# Patient Record
Sex: Male | Born: 1935 | Race: White | Hispanic: No | Marital: Married | Smoking: Never smoker
Health system: Southern US, Community
[De-identification: ages and names within clinical notes are randomized; demographics above are authoritative.]

## PROBLEM LIST (undated history)

## (undated) DIAGNOSIS — R011 Cardiac murmur, unspecified: Secondary | ICD-10-CM

## (undated) DIAGNOSIS — R32 Unspecified urinary incontinence: Secondary | ICD-10-CM

## (undated) DIAGNOSIS — E785 Hyperlipidemia, unspecified: Secondary | ICD-10-CM

## (undated) DIAGNOSIS — T7840XA Allergy, unspecified, initial encounter: Secondary | ICD-10-CM

## (undated) DIAGNOSIS — F419 Anxiety disorder, unspecified: Secondary | ICD-10-CM

## (undated) HISTORY — DX: Allergy, unspecified, initial encounter: T78.40XA

## (undated) HISTORY — DX: Hyperlipidemia, unspecified: E78.5

## (undated) HISTORY — DX: Anxiety disorder, unspecified: F41.9

## (undated) HISTORY — DX: Unspecified urinary incontinence: R32

## (undated) HISTORY — DX: Cardiac murmur, unspecified: R01.1

---

## 2017-09-17 HISTORY — PX: AORTIC VALVE REPLACEMENT: SHX41

## 2017-12-16 HISTORY — PX: BACK SURGERY: SHX140

## 2018-04-17 HISTORY — PX: LUMBAR DISC SURGERY: SHX700

## 2018-08-20 ENCOUNTER — Ambulatory Visit (INDEPENDENT_AMBULATORY_CARE_PROVIDER_SITE_OTHER): Payer: Federal, State, Local not specified - PPO | Admitting: Family Medicine

## 2018-08-20 ENCOUNTER — Encounter: Payer: Self-pay | Admitting: Family Medicine

## 2018-08-20 ENCOUNTER — Ambulatory Visit: Payer: Self-pay | Admitting: Family Medicine

## 2018-08-20 VITALS — BP 120/55 | HR 75 | Temp 98.0°F | Resp 16 | Ht 69.0 in

## 2018-08-20 DIAGNOSIS — Z952 Presence of prosthetic heart valve: Secondary | ICD-10-CM

## 2018-08-20 DIAGNOSIS — Z7689 Persons encountering health services in other specified circumstances: Secondary | ICD-10-CM

## 2018-08-20 DIAGNOSIS — R05 Cough: Secondary | ICD-10-CM

## 2018-08-20 DIAGNOSIS — M150 Primary generalized (osteo)arthritis: Secondary | ICD-10-CM

## 2018-08-20 DIAGNOSIS — M159 Polyosteoarthritis, unspecified: Secondary | ICD-10-CM

## 2018-08-20 DIAGNOSIS — R531 Weakness: Secondary | ICD-10-CM | POA: Diagnosis not present

## 2018-08-20 DIAGNOSIS — J019 Acute sinusitis, unspecified: Secondary | ICD-10-CM

## 2018-08-20 DIAGNOSIS — F419 Anxiety disorder, unspecified: Secondary | ICD-10-CM

## 2018-08-20 DIAGNOSIS — M5441 Lumbago with sciatica, right side: Secondary | ICD-10-CM

## 2018-08-20 DIAGNOSIS — R058 Other specified cough: Secondary | ICD-10-CM

## 2018-08-20 DIAGNOSIS — I10 Essential (primary) hypertension: Secondary | ICD-10-CM

## 2018-08-20 DIAGNOSIS — E782 Mixed hyperlipidemia: Secondary | ICD-10-CM

## 2018-08-20 DIAGNOSIS — F330 Major depressive disorder, recurrent, mild: Secondary | ICD-10-CM

## 2018-08-20 DIAGNOSIS — G609 Hereditary and idiopathic neuropathy, unspecified: Secondary | ICD-10-CM

## 2018-08-20 DIAGNOSIS — N3946 Mixed incontinence: Secondary | ICD-10-CM

## 2018-08-20 DIAGNOSIS — M5442 Lumbago with sciatica, left side: Secondary | ICD-10-CM

## 2018-08-20 DIAGNOSIS — G8929 Other chronic pain: Secondary | ICD-10-CM

## 2018-08-20 MED ORDER — PSEUDOEPH-BROMPHEN-DM 30-2-10 MG/5ML PO SYRP: 5.0000 mL | ORAL_SOLUTION | Freq: Four times a day (QID) | ORAL | 0 refills | Status: AC | PRN

## 2018-08-20 MED ORDER — FLUTICASONE PROPIONATE 50 MCG/ACT NA SUSP: 2.0000 | Freq: Every day | NASAL | 1 refills | Status: AC

## 2018-08-20 NOTE — Patient Instructions (Addendum)
Thank you for coming to the office today.  Please call and check with insurance coverage  [facility] / Surgery performed at [facility] [address] [place], [place] [address] Ph - [phone]  If you need a referral please call us back to notify us - otherwise if they cannot cover with future insurance - then we will need to find a different Neurosurgery specialist.  -----------------------------------------  Referral to [facility] locally for home health - PT, OT, RN and SW - they will help coordinate his care, ultimately if he does require higher level of care then we may go to SNF in future.  ------------------  Start nasal steroid Flonase 2 sprays in each nostril daily for 4-6 weeks, may repeat course seasonally or as needed  Start cough syrup   Please schedule a Follow-up Appointment to: Return in about 6 weeks (around 10/01/2018) for Follow-up 6 weeks for home health progress /  neuropathy / spine surgery.  If you have any other questions or concerns, please feel free to call the office or send a message through MyChart. You may also schedule an earlier appointment if necessary.  Additionally, you may be receiving a survey about your experience at our office within a few days to 1 week by e-mail or mail. We value your feedback.  Saralyn PilarAlexander Bushra Denman, DO [facility]

## 2018-08-20 NOTE — Progress Notes (Addendum)
Subjective:    Patient ID: Francisco Gomez, male    DOB: August 28, 1927, 84 y.o.   MRN: [id number]  Francisco Gomez is a 84 y.o. male presenting on 08/20/2018 for Establish Care (left skill nursing fro [facility] need pateint to be seen by [facility] physician so he can gets order like PT,OT ); Numbness (both feet patient has back surgery around 12/2017. ); and Sinusitis (onset 5 days runny nose)  Here to establish as new patient. Accompanied by daughter in law and his wife who provide additional history. Patient has recently moved to [name] from [place] after he was residing in SNF, now home to be with family as help with caregiver, they are in need of HH orders.  HPI  Generalized Debilitation/Weakness / Chronic Back Pain / Osteoarthritis, s/p Lumbar Spinal Surgery, herniated disc repair / Peripheral Neuropathy Bilateral Lower Extremity - Reviewed past history with fall on ice back in [place] last winter 2018, suffered back injury, ultimately required back surgery 12/2017, later he had need for repeat back surgery in 04/2018 for herniated disc, placed into SNF, and then discharged to ALF but required escalated care and returned to SNF. He was at [facility] until just recently before Thanksgiving when moved down to [place] to be with family. They are now asking to transfer care to home health [facility] for re-evaluation and management with home therapy and assistance. Unsure if he needs higher level of care. - He has significant limited mobility with ambulation, has had weakness affecting gait and numbness in bilateral feet - seems to be new problem with worsening numbness over past 1-2 weeks after long travel, he has history of similar but not this significant, he already has history of spinal nerve damage is on Lyrica high dose after back surgery, on Tramadol PRN - Patient's daughter in law is primary caregiver now, his wife is unable to provide adequate care and his son is not home significant amount of  time  Cardiac Disease / S/p Aortic Valve Replacement 09/2017 - was on plavix has been taken off.  Abnormality Incidental finding of bladder/kidney Requested record for more information. But he reports - Aortic Valve replaced in January 2019 - discovered a spot on kidney or bladder on MRI - he had an appointment with Urologist, but then it was cancelled and never re-scheduled.  Additional complaint - Rhinosinusitis vs URI cough - reports recent worsening vs persistent cough, non productive, has runny nose and sinus congestion, without fever chills or sinus purulence. Denies muscle aches or body aches. Taking coricidan asking for other cough medicine.   Health Maintenance: Requesting outside records from prior PCP  Depression screen [facility] 2/9 08/21/2018  Decreased Interest 1  Down, Depressed, Hopeless 2  PHQ - 2 Score 3  Altered sleeping 2  Tired, decreased energy 3  Change in appetite 0  Feeling bad or failure about yourself  3  Trouble concentrating 2  Moving slowly or fidgety/restless 0  Suicidal thoughts 0  PHQ-9 Score 13  Difficult doing work/chores Somewhat difficult   GAD 7 : Generalized Anxiety Score 08/21/2018  Nervous, Anxious, on Edge 2  Control/stop worrying 1  Worry too much - different things 1  Trouble relaxing 0  Restless 1  Easily annoyed or irritable 0  Afraid - awful might happen 2  Total GAD 7 Score 7  Anxiety Difficulty Very difficult      No flowsheet data found.  Past Medical History:  Diagnosis Date  . Allergy   .  Anxiety   . Heart murmur   . Hyperlipidemia   . Urinary incontinence    Past Surgical History:  Procedure Laterality Date  . AORTIC VALVE REPLACEMENT  09/2017  . [place] - required repeat surgery 04/2018 Herniated disc  . LUMBAR DISC SURGERY  04/2018   Social History   Socioeconomic History  . Marital status: Married    Spouse name: Not on file  . Number of children: Not on file  . Years  of education: Not on file  . Highest education level: Not on file  Occupational History  . Not on file  Social Needs  . Financial resource strain: Not on file  . Food insecurity:    Worry: Not on file    Inability: Not on file  . Transportation needs:    Medical: Not on file    Non-medical: Not on file  Tobacco Use  . Smoking status: Never Smoker  . Smokeless tobacco: Never Used  Substance and Sexual Activity  . Alcohol use: Yes    Alcohol/week: 7.0 standard drinks    Types: 7 Standard drinks or equivalent per week    Frequency: Never  . Drug use: Never  . Sexual activity: Not on file  Lifestyle  . Physical activity:    Days per week: Not on file    Minutes per session: Not on file  . Stress: Not on file  Relationships  . Social connections:    Talks on phone: Not on file    Gets together: Not on file    Attends religious service: Not on file    Active member of club or organization: Not on file    Attends meetings of clubs or organizations: Not on file    Relationship status: Not on file  . Intimate partner violence:    Fear of current or ex partner: Not on file    Emotionally abused: Not on file    Physically abused: Not on file    Forced sexual activity: Not on file  Other Topics Concern  . Not on file  Social History Narrative  . Not on file   History reviewed. No pertinent family history. Current Outpatient Medications on File Prior to Visit  Medication Sig  . acetaminophen (TYLENOL) 325 MG tablet Take 650 mg by mouth every 6 (six) hours as needed.  . Acetaminophen 325 MG CAPS Take by mouth.  Marland Kitchen. allopurinol (ZYLOPRIM) 300 MG tablet Take 300 mg by mouth daily.  Marland Kitchen. amLODipine (NORVASC) 5 MG tablet Take 5 mg by mouth daily.  Marland Kitchen. aspirin EC 81 MG tablet Take 81 mg by mouth daily.  Marland Kitchen. atorvastatin (LIPITOR) 80 MG tablet Take 80 mg by mouth at bedtime.  . chlorthalidone (HYGROTON) 25 MG tablet Take 25 mg by mouth daily.  . citalopram (CELEXA) 10 MG tablet Take 10 mg  by mouth daily.  Marland Kitchen. DOCUSATE SODIUM PO Take 100 mg by mouth once.  . finasteride (PROSCAR) 5 MG tablet Take 5 mg by mouth daily.  . hydrOXYzine (ATARAX/VISTARIL) 25 MG tablet Take 25 mg by mouth every 8 (eight) hours as needed.  . lactulose (CHRONULAC) 10 GM/15ML solution Take by mouth 3 (three) times daily.  Marland Kitchen. latanoprost (XALATAN) 0.005 % ophthalmic solution 1 drop at bedtime.  . Magnesium Hydroxide (MILK OF MAGNESIA PO) Take 30 mLs by mouth.  . methylPREDNISolone (MEDROL) 4 MG tablet Take 4 mg by mouth daily.  . metoprolol succinate (TOPROL-XL) 50 MG 24  hr tablet Take 50 mg by mouth daily. Take with or immediately following a meal.  . mineral oil enema Place 1 enema rectally once.  . nitroGLYCERIN (NITROSTAT) 0.4 MG SL tablet Place 0.4 mg under the tongue every 5 (five) minutes as needed for chest pain.  Marland Kitchen omeprazole (PRILOSEC) 20 MG capsule Take 20 mg by mouth daily.  . pregabalin (LYRICA) 200 MG capsule Take 200 mg by mouth 2 (two) times daily.  Marland Kitchen rOPINIRole (REQUIP) 0.25 MG tablet Take 0.25 mg by mouth at bedtime.  . senna (SENOKOT) 8.6 MG TABS tablet Take 1 tablet by mouth.  . tamsulosin (FLOMAX) 0.4 MG CAPS capsule Take 0.4 mg by mouth.  . traMADol (ULTRAM) 50 MG tablet Take by mouth every 6 (six) hours as needed.   No current facility-administered medications on file prior to visit.     Review of Systems Per HPI unless specifically indicated above     Objective:    BP (!) 120/55   Pulse 75   Temp 98 F (36.7 C) (Oral)   Resp 16   Ht 5\' 9"  (1.753 m)   SpO2 99%   Wt Readings from Last 3 Encounters:  No data found for Wt    Physical Exam  Constitutional: He is oriented to person, place, and time. He appears well-developed and well-nourished. No distress.  Elderly 84 year old male, mostly comfortable but wheelchair bound, cooperative  HENT:  Head: Normocephalic and atraumatic.  Mouth/Throat: Oropharynx is clear and moist.  Frontal / maxillary sinuses non-tender. Nares  patent with some rhinorrhea and congestion without purulence. Bilateral TMs clear without erythema, effusion or bulging. Oropharynx with some post nasal drainage without erythema, exudates, edema or asymmetry.  Eyes: Conjunctivae are normal. Right eye exhibits no discharge. Left eye exhibits no discharge.  Cardiovascular: Normal rate.  Pulmonary/Chest: Effort normal. No respiratory distress. He has no wheezes. He has no rales.  Musculoskeletal: He exhibits edema (+1 pitting edema lower extremity symmetrical).  Generalized muscle weakness on exam  Neurological: He is alert and oriented to person, place, and time. A sensory deficit is present. No cranial nerve deficit.  Significantly reduced sensation to light touch and monofilament bilateral lower extremity ankles to feet  Skin: Skin is warm and dry. No rash noted. He is not diaphoretic. No erythema.  Psychiatric: He has a normal mood and affect. His behavior is normal.  Well groomed, good eye contact, normal speech and thoughts  Nursing note and vitals reviewed.  No results found for this or any previous visit.    Assessment & Plan:   Problem List Items Addressed This Visit    Anxiety   Relevant Medications   hydrOXYzine (ATARAX/VISTARIL) 25 MG tablet   citalopram (CELEXA) 10 MG tablet   Chronic back pain   Relevant Medications   aspirin EC 81 MG tablet   traMADol (ULTRAM) 50 MG tablet   methylPREDNISolone (MEDROL) 4 MG tablet   acetaminophen (TYLENOL) 325 MG tablet   Acetaminophen 325 MG CAPS   Essential hypertension   Relevant Medications   aspirin EC 81 MG tablet   nitroGLYCERIN (NITROSTAT) 0.4 MG SL tablet   metoprolol succinate (TOPROL-XL) 50 MG 24 hr tablet   atorvastatin (LIPITOR) 80 MG tablet   chlorthalidone (HYGROTON) 25 MG tablet   amLODipine (NORVASC) 5 MG tablet   Major depression, recurrent (HCC)   Relevant Medications   hydrOXYzine (ATARAX/VISTARIL) 25 MG tablet   citalopram (CELEXA) 10 MG tablet   Mixed  hyperlipidemia   Relevant  Medications   aspirin EC 81 MG tablet   nitroGLYCERIN (NITROSTAT) 0.4 MG SL tablet   metoprolol succinate (TOPROL-XL) 50 MG 24 hr tablet   atorvastatin (LIPITOR) 80 MG tablet   chlorthalidone (HYGROTON) 25 MG tablet   amLODipine (NORVASC) 5 MG tablet   Osteoarthritis, multiple sites Underlying factor for some chronic pain and debilitation    Relevant Medications   aspirin EC 81 MG tablet   traMADol (ULTRAM) 50 MG tablet   methylPREDNISolone (MEDROL) 4 MG tablet   acetaminophen (TYLENOL) 325 MG tablet   allopurinol (ZYLOPRIM) 300 MG tablet   Acetaminophen 325 MG CAPS   S/P aortic valve replacement Future refer to Cardiology to monitor cardiac disease - review records    Urinary incontinence, mixed Chronic problem, no recent worsening with back injury and problems  Continue meds, future may refer to Urologist for 2nd opinion on prior report of abnormality kidney/bladder on imaging incidental, will need review records    Relevant Medications   tamsulosin (FLOMAX) 0.4 MG CAPS capsule   finasteride (PROSCAR) 5 MG tablet    Other Visit Diagnoses    Acute rhinosinusitis    -  Primary Clinically without sign of bacterial infection. Lungs clear, sinuses non tender, no AOM. Reassuring history, has persistent rhinorrhea w/ cough post nasal drainage most likely  Start nasal steroid Flonase 2 sprays in each nostril daily for 4-6 weeks, may repeat course seasonally or as needed Cough syrup rx as well    Relevant Medications   methylPREDNISolone (MEDROL) 4 MG tablet   fluticasone (FLONASE) 50 MCG/ACT nasal spray   brompheniramine-pseudoephedrine-DM 30-2-10 MG/5ML syrup   Recurrent cough       Relevant Medications   brompheniramine-pseudoephedrine-DM 30-2-10 MG/5ML syrup   Encounter to establish care with new doctor     Request records from prior PCP and specialists including Orthopedic, Cardiology      Generalized Weakness Peripheral  Neuropathy Significantly limited mobility due to variety of chronic illnesses and pain/OA/DJD neuropathy Increased level of care suspected given previous SNF management Needs assistance with some ADLs and transfers now, limited family caregiver support primarily only with daughter.  Plan Order [facility] PT OT RN CSW - future may need placement at SNF if not able to meet goals Will refer to Neurosurgery for 2nd opinion on neuropathy in legs and if likely related to spinal OA/DJD disease - pending family to check with Tamsen Snider first for coverage  Meds ordered this encounter  Medications  . fluticasone (FLONASE) 50 MCG/ACT nasal spray    Sig: Place 2 sprays into both nostrils daily. Use for 4-6 weeks then stop and use seasonally or as needed.    Dispense:  16 g    Refill:  1  . brompheniramine-pseudoephedrine-DM 30-2-10 MG/5ML syrup    Sig: Take 5 mLs by mouth 4 (four) times daily as needed.    Dispense:  118 mL    Refill:  0   Orders Placed This Encounter  Procedures  . Ambulatory referral to Home Health    Referral Priority:   Routine    Referral Type:   Home Health Care    Referral Reason:   Specialty Services Required    Referred to Provider:   [facility]    Requested Specialty:   Home Health Services    Number of Visits Requested:   1   Requesting PT, OT, RN and medical social worker involvement, previously discharged from SNF in [place] about 1-2 weeks ago in November to  move down to [place] with family, however he does not have 24 hour caregiver support and may need additional help, need to determine if he can receive appropriate care at home or may need to reconsider SNF  Follow up plan: Return in about 6 weeks (around 10/01/2018) for Follow-up 6 weeks for home health progress /  neuropathy / spine surgery.  Saralyn Pilar, DO [facility] 08/20/2018, 3:49 PM

## 2018-08-21 ENCOUNTER — Encounter: Payer: Self-pay | Admitting: Family Medicine

## 2018-08-21 DIAGNOSIS — F419 Anxiety disorder, unspecified: Secondary | ICD-10-CM | POA: Insufficient documentation

## 2018-08-21 DIAGNOSIS — I10 Essential (primary) hypertension: Secondary | ICD-10-CM | POA: Insufficient documentation

## 2018-08-21 DIAGNOSIS — M159 Polyosteoarthritis, unspecified: Secondary | ICD-10-CM | POA: Insufficient documentation

## 2018-08-21 DIAGNOSIS — M549 Dorsalgia, unspecified: Secondary | ICD-10-CM

## 2018-08-21 DIAGNOSIS — N3946 Mixed incontinence: Secondary | ICD-10-CM | POA: Insufficient documentation

## 2018-08-21 DIAGNOSIS — Z952 Presence of prosthetic heart valve: Secondary | ICD-10-CM | POA: Insufficient documentation

## 2018-08-21 DIAGNOSIS — G609 Hereditary and idiopathic neuropathy, unspecified: Secondary | ICD-10-CM | POA: Insufficient documentation

## 2018-08-21 DIAGNOSIS — E782 Mixed hyperlipidemia: Secondary | ICD-10-CM | POA: Insufficient documentation

## 2018-08-21 DIAGNOSIS — F339 Major depressive disorder, recurrent, unspecified: Secondary | ICD-10-CM | POA: Insufficient documentation

## 2018-08-21 DIAGNOSIS — R531 Weakness: Secondary | ICD-10-CM | POA: Insufficient documentation

## 2018-08-21 DIAGNOSIS — G8929 Other chronic pain: Secondary | ICD-10-CM | POA: Insufficient documentation

## 2018-08-22 ENCOUNTER — Telehealth: Payer: Self-pay | Admitting: Family Medicine

## 2018-08-22 NOTE — Telephone Encounter (Signed)
Pt daughter called Ines BloomerSherry Kraska requesting that you call her about her father appt. ( that it was not safe to be at home ) [facility] # [phone]

## 2018-08-22 NOTE — Telephone Encounter (Signed)
Cordelia PenSherry advised as per Dr. Kirtland BouchardK.

## 2018-08-22 NOTE — Telephone Encounter (Signed)
I spoke with Rachell about this triaged call. The daughter, Cordelia PenSherry is not on DPR list to my knowledge, reviewed the scanned document.  It sounds like there is a disconnect or miscommunication between [place] and her family, because at our visit with Fenton FoyFrank Vicens and the other family members, there was no problem with our plan and recommendations. I did not specifically say that he was not safe to be at home as well.  Please contact Cordelia PenSherry back and let her know that we cannot share clinical information due to her not being on DPR. I recommend that she discuss her concerns with the family members present at the visit - and if she still has questions she may accompany the patient at the next office visit or arrange to have Homero FellersFrank sign a new form to add her to [facility].  We can call the other family member back if they have questions to clarify any issues.  Home health should be starting soon through [facility]  Saralyn PilarAlexander Karamalegos, DO [facility] 08/22/2018, 10:51 AM

## 2018-08-25 ENCOUNTER — Telehealth: Payer: Self-pay | Admitting: Family Medicine

## 2018-08-25 NOTE — Telephone Encounter (Signed)
Spoke to son was concerned about his feet and hand tingling and urine incontinence. Son was not aware from other family member about dad's condition and he is taking care of him now and his wife is not professional care taker either as per after visit summery he will get referred to Urologist and neurologist but it can take months and now he frustrated and at the point where he wants to take him to ER. He specifically want to talk to Dr. Kirtland BouchardK about all his concerned regarding his dad health condition.

## 2018-08-25 NOTE — Telephone Encounter (Signed)
See telephone note from earlier today 08/25/18.  Discussed concerns with patient's son Morton StallFrank Jr. His sister and patient's daughter, Cordelia PenSherry, called to discuss as well. She is a Engineer, civil (consulting)urse and has several questions.  I advised her to first check with her brother Morton StallFrank Jr to clarify what we discussed so that we do not repeat phone calls too often. She will do this.  I reviewed briefly our plan. She expressed similar concerns.  In addition, with urinary incontinence and urgency, she asked if his dose of Chlorthalidone could be lowered, I advised we can try cut in HALF for Chlorthalidone 12.5mg  dose daily (HALF of 25mg ) this may reduce some urinary urgency and frequency. In future we can consider Urologist.  She discussed his heart history, for now we are waiting to refer to Cards, given goal to stabilize him currently with regards to neuropathy pain and function.  [facility] or may go to hospital if acute worsening - in future we may refer to Neurosurgery/neurology when ready.  Saralyn PilarAlexander Rickelle Sylvestre, DO [facility] 08/25/2018, 4:57 PM

## 2018-08-25 NOTE — Telephone Encounter (Signed)
Pt's daughter Cordelia PenSherry asked for a call back to discuss discrepancies in what pt's care should be [phone]

## 2018-08-25 NOTE — Telephone Encounter (Signed)
Called patient's son, Francisco Gomez, back and discussed his concerns. He expresses several:  1. Neuropathy - bilateral feet, has had this worsening problem more recently. This is a chronic issue that has significantly worsened, and it is concerning patient's family, given his spinal surgery history. I recommended Neurosurgery consultation referral - but am still waiting to hear back from family which provider is covered or that they request. This referral has not been placed yet  2. HH vs SNF - He has HH services about to initiate this Wednesday 12/11 - however family is concerned he is in an unsafe environment at home due to not having 24 hour skilled care, they think that he may have inappropriately left the SNF in [place] and now he needs to return to SNF.  They are asking if he should be seen at hospital now for eval of neuropathy and urinary incontinence, and may determine further treatment or referral or may benefit from expedited discharge to SNF or home services.  I advised that if needed they may seek care at hospital given the time situation urgency, if not then on Weds when [facility] they may determine needs SNF - once this has been arranged, they can locate a bed and we can proceed with FL2  Francisco PilarAlexander Thadd Apuzzo, DO [facility] 08/25/2018, 1:42 PM

## 2018-08-25 NOTE — Telephone Encounter (Signed)
Pt's son, Homero FellersFrank asked for a call back to discuss developments from over the weekend [phone]

## 2018-08-28 ENCOUNTER — Emergency Department: Payer: Federal, State, Local not specified - PPO

## 2018-08-28 ENCOUNTER — Other Ambulatory Visit: Payer: Self-pay

## 2018-08-28 ENCOUNTER — Emergency Department
Admission: EM | Admit: 2018-08-28 | Discharge: 2018-08-28 | Disposition: A | Discharge status: Home / Self Care | Payer: Federal, State, Local not specified - PPO | Attending: Emergency Medicine | Admitting: Emergency Medicine

## 2018-08-28 ENCOUNTER — Encounter: Payer: Self-pay | Admitting: Emergency Medicine

## 2018-08-28 DIAGNOSIS — R32 Unspecified urinary incontinence: Secondary | ICD-10-CM | POA: Insufficient documentation

## 2018-08-28 DIAGNOSIS — Z952 Presence of prosthetic heart valve: Secondary | ICD-10-CM | POA: Insufficient documentation

## 2018-08-28 DIAGNOSIS — M25551 Pain in right hip: Secondary | ICD-10-CM | POA: Diagnosis not present

## 2018-08-28 DIAGNOSIS — Z7982 Long term (current) use of aspirin: Secondary | ICD-10-CM | POA: Diagnosis not present

## 2018-08-28 DIAGNOSIS — Z79899 Other long term (current) drug therapy: Secondary | ICD-10-CM | POA: Insufficient documentation

## 2018-08-28 DIAGNOSIS — R2243 Localized swelling, mass and lump, lower limb, bilateral: Secondary | ICD-10-CM | POA: Diagnosis not present

## 2018-08-28 DIAGNOSIS — R531 Weakness: Secondary | ICD-10-CM | POA: Diagnosis present

## 2018-08-28 DIAGNOSIS — N3289 Other specified disorders of bladder: Secondary | ICD-10-CM | POA: Insufficient documentation

## 2018-08-28 DIAGNOSIS — I10 Essential (primary) hypertension: Secondary | ICD-10-CM | POA: Diagnosis not present

## 2018-08-28 LAB — BASIC METABOLIC PANEL
Anion gap: 11 (ref 5–15)
BUN: 26 mg/dL — ABNORMAL HIGH (ref 8–23)
CO2: 26 mmol/L (ref 22–32)
Calcium: 8.9 mg/dL (ref 8.9–10.3)
Chloride: 92 mmol/L — ABNORMAL LOW (ref 98–111)
Creatinine, Ser: 1.14 mg/dL (ref 0.61–1.24)
GFR calc Af Amer: >60 mL/min (ref >60)
GFR calc non Af Amer: 57 mL/min — ABNORMAL LOW (ref >60)
Glucose, Bld: 121 mg/dL — ABNORMAL HIGH (ref 70–99)
Potassium: 3.4 mmol/L — ABNORMAL LOW (ref 3.5–5.1)
Sodium: 129 mmol/L — ABNORMAL LOW (ref 135–145)

## 2018-08-28 LAB — CBC WITH DIFFERENTIAL/PLATELET
Abs Immature Granulocytes: 0.06 10*3/uL (ref 0.00–0.07)
Basophils Absolute: 0 10*3/uL (ref 0.0–0.1)
Basophils Relative: 0 %
EOS ABS: 0.1 10*3/uL (ref 0.0–0.5)
Eosinophils Relative: 1 %
HCT: 30.3 % — ABNORMAL LOW (ref 39.0–52.0)
Hemoglobin: 10 g/dL — ABNORMAL LOW (ref 13.0–17.0)
Immature Granulocytes: 1 %
Lymphocytes Relative: 16 %
Lymphs Abs: 0.9 10*3/uL (ref 0.7–4.0)
MCH: 31.7 pg (ref 26.0–34.0)
MCHC: 33 g/dL (ref 30.0–36.0)
MCV: 96.2 fL (ref 80.0–100.0)
MONOS PCT: 8 %
Monocytes Absolute: 0.5 10*3/uL (ref 0.1–1.0)
NRBC: 0 % (ref 0.0–0.2)
Neutro Abs: 4.2 10*3/uL (ref 1.7–7.7)
Neutrophils Relative %: 74 %
Platelets: 148 10*3/uL — ABNORMAL LOW (ref 150–400)
RBC: 3.15 MIL/uL — AB (ref 4.22–5.81)
RDW: 16.4 % — ABNORMAL HIGH (ref 11.5–15.5)
WBC: 5.7 10*3/uL (ref 4.0–10.5)

## 2018-08-28 LAB — URINALYSIS, COMPLETE (UACMP) WITH MICROSCOPIC
Bacteria, UA: NONE SEEN
Bilirubin Urine: NEGATIVE
Glucose, UA: NEGATIVE mg/dL
KETONES UR: NEGATIVE mg/dL
Leukocytes, UA: NEGATIVE
Nitrite: NEGATIVE
Protein, ur: NEGATIVE mg/dL
Specific Gravity, Urine: 1.01 (ref 1.005–1.030)
pH: 7 (ref 5.0–8.0)

## 2018-08-28 NOTE — ED Triage Notes (Signed)
Pt is wheelchair bound, c/o right hip pain, states he has no use of his feet-they feel numb, have seen a Dr about this issue, here today because they want "tests done." Appears in NAD.

## 2018-08-28 NOTE — Progress Notes (Signed)
PT Cancellation Note  Patient Details Name: Francisco Gomez MRN: [id number] DOB: Jul 26, 1927   Cancelled Treatment:    Reason Eval/Treat Not Completed: (Consult received and chart reviewed.  Per Child psychotherapistsocial worker, patient and family planning to return home with [facility]. No acute indication for PT evaluation at this time.  Please re-consult should needs change.)   Felicie Kocher H. Manson PasseyBrown, PT, DPT, NCS 08/28/18, 2:35 PM [phone]

## 2018-08-28 NOTE — ED Notes (Signed)
Patient transported to Ultrasound 

## 2018-08-28 NOTE — Progress Notes (Signed)
LCSW consulted with family and EDP and PT therapist and EDRN. The family will return home once the patient is medically cleared. As per the family the patient has [facility] engaging with patient for PT OT RN and SW and all equipment has been delivered. LCSW suggested getting patient a hoist they will [facility]   Adonys Wildes LCSW [phone]

## 2018-08-28 NOTE — ED Notes (Signed)
Social Work at bedside for assessment.

## 2018-08-28 NOTE — ED Notes (Signed)
Return from US.  AAOx3.  Skin warm and dry. NAD 

## 2018-08-28 NOTE — ED Provider Notes (Signed)
[facility] Emergency Department Provider Note  ___________________________________________   First MD Initiated Contact with Patient 08/28/18 [phone]     (approximate)  I have reviewed the triage vital signs and the nursing notes.   HISTORY  Chief Complaint No chief complaint on file.   HPI Francisco Gomez is a 84 y.o. male with a history of multiple back surgeries as well as generalized weakness who is presenting to the emergency department today complaining of worsening weakness to his bilateral lower extremities.  He says that he is also started to experience right-sided hip pain which is made it more difficult for him to walk.  He says that he has fallen on the right hip but has not had any head trauma.  States that when he tries to move his right leg that he experiences right hip pain.  Is here with family who reports that he has had a decline in his mobility over the past several months after having surgery.  Was up until recently in a skilled nursing facility in [place] but then came [place] to [place] to be with family over Thanksgiving and is state ever since.  Patient also was recently on a low-dose steroid for approximately 4 weeks and is now out of the low-dose steroid.  Denies any back pain at this time but says that he has worsening back pain with movement.  Also with worsening urinary incontinence.  No bowel incontinence.  He has needed to wear an adult diaper now for months.   Past Medical History:  Diagnosis Date  . Allergy   . Anxiety   . Heart murmur   . Hyperlipidemia   . Urinary incontinence     Patient Active Problem List   Diagnosis Date Noted  . Essential hypertension 08/21/2018  . Major depression, recurrent (HCC) 08/21/2018  . Urinary incontinence, mixed 08/21/2018  . Anxiety 08/21/2018  . Mixed hyperlipidemia 08/21/2018  . S/P aortic valve replacement 08/21/2018  . Chronic back pain 08/21/2018  . Osteoarthritis, multiple  sites 08/21/2018  . Idiopathic peripheral neuropathy 08/21/2018  . Generalized weakness 08/21/2018    Past Surgical History:  Procedure Laterality Date  . AORTIC VALVE REPLACEMENT  09/2017  . BACK SURGERY  12/2017   [place], low back - required repeat surgery 04/2018 Herniated disc  . LUMBAR DISC SURGERY  04/2018    Prior to Admission medications   Medication Sig Start Date End Date Taking? Authorizing Provider  acetaminophen (TYLENOL) 325 MG tablet Take 650 mg by mouth every 6 (six) hours as needed.    [provider]  Acetaminophen 325 MG CAPS Take by mouth.    [provider]  allopurinol (ZYLOPRIM) 300 MG tablet Take 300 mg by mouth daily.    [provider]  amLODipine (NORVASC) 5 MG tablet Take 5 mg by mouth daily.    [provider]  aspirin EC 81 MG tablet Take 81 mg by mouth daily.    [provider]  atorvastatin (LIPITOR) 80 MG tablet Take 80 mg by mouth at bedtime.    [provider]  brompheniramine-pseudoephedrine-DM 30-2-10 MG/5ML syrup Take 5 mLs by mouth 4 (four) times daily as needed. 08/20/18   Karamalegos, Netta NeatAlexander J, DO  chlorthalidone (HYGROTON) 25 MG tablet Take 12.5 mg by mouth daily. Half tablet daily    [provider]  citalopram (CELEXA) 10 MG tablet Take 10 mg by mouth daily.    [provider]  DOCUSATE SODIUM PO Take 100 mg  by mouth once.    [provider]  finasteride (PROSCAR) 5 MG tablet Take 5 mg by mouth daily.    [provider]  fluticasone (FLONASE) 50 MCG/ACT nasal spray Place 2 sprays into both nostrils daily. Use for 4-6 weeks then stop and use seasonally or as needed. 08/20/18   Karamalegos, Netta Neat, DO  hydrOXYzine (ATARAX/VISTARIL) 25 MG tablet Take 25 mg by mouth every 8 (eight) hours as needed.    [provider]  lactulose (CHRONULAC) 10 GM/15ML solution Take by mouth 3 (three) times daily.    [provider]  latanoprost  (XALATAN) 0.005 % ophthalmic solution 1 drop at bedtime.    [provider]  Magnesium Hydroxide (MILK OF MAGNESIA PO) Take 30 mLs by mouth.    [provider]  methylPREDNISolone (MEDROL) 4 MG tablet Take 4 mg by mouth daily.    [provider]  metoprolol succinate (TOPROL-XL) 50 MG 24 hr tablet Take 50 mg by mouth daily. Take with or immediately following a meal.    [provider]  mineral oil enema Place 1 enema rectally once.    [provider]  nitroGLYCERIN (NITROSTAT) 0.4 MG SL tablet Place 0.4 mg under the tongue every 5 (five) minutes as needed for chest pain.    [provider]  omeprazole (PRILOSEC) 20 MG capsule Take 20 mg by mouth daily.    [provider]  pregabalin (LYRICA) 200 MG capsule Take 200 mg by mouth 2 (two) times daily.    [provider]  rOPINIRole (REQUIP) 0.25 MG tablet Take 0.25 mg by mouth at bedtime.    [provider]  senna (SENOKOT) 8.6 MG TABS tablet Take 1 tablet by mouth.    [provider]  tamsulosin (FLOMAX) 0.4 MG CAPS capsule Take 0.4 mg by mouth.    [provider]  traMADol (ULTRAM) 50 MG tablet Take by mouth every 6 (six) hours as needed.    [provider]    Allergies Shellfish allergy  No family history on file.  Social History Social History   Tobacco Use  . Smoking status: Never Smoker  . Smokeless tobacco: Never Used  Substance Use Topics  . Alcohol use: Yes    Alcohol/week: 7.0 standard drinks    Types: 7 Standard drinks or equivalent per week    Frequency: Never  . Drug use: Never    Review of Systems  Constitutional: No fever/chills Eyes: No visual changes. ENT: No sore throat. Cardiovascular: Denies chest pain. Respiratory: Denies shortness of breath. Gastrointestinal: No abdominal pain.  No nausea, no vomiting.  No diarrhea.  No constipation. Genitourinary: Negative for dysuria. Musculoskeletal: As  above Skin: Negative for rash. Neurological: Negative for headaches,   ____________________________________________   PHYSICAL EXAM:  VITAL SIGNS: ED Triage Vitals [08/28/18 0949]  Enc Vitals Group     BP (!) 122/49     Pulse Rate 79     Resp 18     Temp 98.1 F (36.7 C)     Temp Source Oral     SpO2 97 %     Weight 170 lb (77.1 kg)     Height 5\' 10"  (1.778 m)     Head Circumference      Peak Flow      Pain Score 0     Pain Loc      Pain Edu?      Excl. in GC?     Constitutional: Alert and oriented.  Well appearing and in no acute distress. Eyes: Conjunctivae are normal.  Head: Atraumatic. Nose: No congestion/rhinnorhea. Mouth/Throat: Mucous membranes are moist.  Neck: No stridor.   Cardiovascular: Normal rate, regular rhythm. Grossly normal heart sounds.  Good peripheral circulation with equal and bilateral dorsalis pedis pulses. Respiratory: Normal respiratory effort.  No retractions. Lungs CTAB. Gastrointestinal: Soft and nontender. No distention. No CVA tenderness. Musculoskeletal: Moderate bilateral lower extremity edema.  Negative straight leg raise bilaterally.  No tenderness to palpation of the right hip.  No tenderness to palpation to the thoracic or lumbar spines.  Surgical scars appear well-healed.  Patient with 3 out of 5 strength to the right lower extremity secondary to pain to the right hip.  5 out of 5 strength to plantar flexion, bilaterally.  No saddle anesthesia.  Dorsalis pedis pulses are present and equal, bilaterally.  Week 4 out of 5 strength in the left lower extremity. Neurologic:  Normal speech and language. No gross focal neurologic deficits are appreciated. Skin:  Skin is warm, dry and intact. No rash noted. Psychiatric: Mood and affect are normal. Speech and behavior are normal.  ____________________________________________   LABS (all labs ordered are listed, but only abnormal results are displayed)  Labs Reviewed  CBC WITH  DIFFERENTIAL/PLATELET - Abnormal; Notable for the following components:      Result Value   RBC 3.15 (*)    Hemoglobin 10.0 (*)    HCT 30.3 (*)    RDW 16.4 (*)    Platelets 148 (*)    All other components within normal limits  BASIC METABOLIC PANEL - Abnormal; Notable for the following components:   Sodium 129 (*)    Potassium 3.4 (*)    Chloride 92 (*)    Glucose, Bld 121 (*)    BUN 26 (*)    GFR calc non Af Amer 57 (*)    All other components within normal limits  URINALYSIS, COMPLETE (UACMP) WITH MICROSCOPIC - Abnormal; Notable for the following components:   Color, Urine YELLOW (*)    APPearance CLEAR (*)    Hgb urine dipstick SMALL (*)    All other components within normal limits   ____________________________________________  EKG   ____________________________________________  RADIOLOGY  Right hip x-ray with symmetric degenerative changes.  No acute bony abnormality.  Bilateral ultrasounds of the lower extremities without acute finding.  No DVT.  CT of the abdomen without acute findings.  Masslike area of mural thickening along the left lateral wall of the urinary bladder.  Concerning for urethral neoplasm.  Old L1 compression fracture with 50% loss of anterior vertebral height. ____________________________________________   PROCEDURES  Procedure(s) performed:   Procedures  Critical Care performed:   ____________________________________________   INITIAL IMPRESSION / ASSESSMENT AND PLAN / ED COURSE  Pertinent labs & imaging results that were available during my care of the patient were reviewed by me and considered in my medical decision making (see chart for details).  DDX: Hip pain, arthritis, hip fracture, lumbar radiculopathy, chronic back pain, compression fracture, cauda equina syndrome, UTI As part of my medical decision making, I reviewed the following data within the electronic MEDICAL RECORD NUMBERReviewed notes from prior outpatient  visits.  ----------------------------------------- 3:04 PM on 08/28/2018 -----------------------------------------  Patient at this time without any further complaints.  Patient's symptoms seem to be chronic in nature but progressive.  I do not believe the patient to have cauda equina syndrome.  He does not have any back pain.  There is no saddle anesthesia.  He has 5 out of 5 strength to plantar flexion.  He also has home health care.  Family will be following back up with home health care to reinstate adequate home care.  We also discussed the lesion to the bladder and the need for urology follow-up because of high probability of cancer.  Patient and family also aware of low sodium.  However, I believe it is unlikely that this level of hyponatremia is causing the patient's symptoms.  They will be following up with their primary care doctor, Dr. Lucy Antigua August.  They report that Dr. Althea Charon will be referring the patient to urology as well as neurology/neurosurgery.  Patient to be discharged at this time.  Social worker, Claudine, also spoke to the patient and family regarding home health plans.  Hip pain were likely related to arthritis.  Hip plain likely causing the right lower extremity immobility issues. ____________________________________________   FINAL CLINICAL IMPRESSION(S) / ED DIAGNOSES  Weakness.  Right hip pain.  NEW MEDICATIONS STARTED DURING THIS VISIT:  New Prescriptions   No medications on file     Note:  This document was prepared using Dragon voice recognition software and may include unintentional dictation errors.     Myrna Blazer, MD 08/28/18 [phone]

## 2018-08-29 ENCOUNTER — Telehealth: Payer: Self-pay | Admitting: Family Medicine

## 2018-08-29 DIAGNOSIS — R600 Localized edema: Secondary | ICD-10-CM

## 2018-08-29 NOTE — Telephone Encounter (Signed)
Incoming call from [facility]

## 2018-08-29 NOTE — Telephone Encounter (Signed)
Francisco SineNancy with [facility] needs a verbal for plan of care for nursing once a week for 2 weeks, then once every other week for 4 weeks.  [phone]

## 2018-08-29 NOTE — Telephone Encounter (Signed)
Agree. I would [facility].  Can you call them back to confirm verbal order.  Thank you.  Saralyn PilarAlexander Karamalegos, DO [facility] 08/29/2018, 1:24 PM

## 2018-09-01 ENCOUNTER — Other Ambulatory Visit: Payer: Self-pay | Admitting: Family Medicine

## 2018-09-01 DIAGNOSIS — M150 Primary generalized (osteo)arthritis: Secondary | ICD-10-CM

## 2018-09-01 DIAGNOSIS — M5441 Lumbago with sciatica, right side: Secondary | ICD-10-CM

## 2018-09-01 DIAGNOSIS — G8929 Other chronic pain: Secondary | ICD-10-CM

## 2018-09-01 DIAGNOSIS — M159 Polyosteoarthritis, unspecified: Secondary | ICD-10-CM

## 2018-09-01 DIAGNOSIS — M961 Postlaminectomy syndrome, not elsewhere classified: Secondary | ICD-10-CM

## 2018-09-01 DIAGNOSIS — N3946 Mixed incontinence: Secondary | ICD-10-CM

## 2018-09-01 DIAGNOSIS — M5442 Lumbago with sciatica, left side: Secondary | ICD-10-CM

## 2018-09-01 MED ORDER — TAMSULOSIN HCL 0.4 MG PO CAPS: 0.4000 mg | ORAL_CAPSULE | Freq: Every day | ORAL | 2 refills | Status: AC

## 2018-09-01 NOTE — Telephone Encounter (Signed)
Pt needs a refill on tamsulosin [facility] [place].  Her call back is [phone]

## 2018-09-02 ENCOUNTER — Telehealth: Payer: Self-pay | Admitting: Family Medicine

## 2018-09-02 NOTE — Telephone Encounter (Signed)
[facility] and find out when it was re-scheduled for? My request is to have him evaluated as soon as able for OT. This was request of family before.  Saralyn PilarAlexander Alsha Meland, DO [facility] 09/02/2018, 12:57 PM

## 2018-09-02 NOTE — Telephone Encounter (Signed)
Harriett Sineancy with [facility] [phone], called said that wife canceled appt for OT to reschedule for a later date

## 2018-09-03 ENCOUNTER — Telehealth: Payer: Self-pay | Admitting: Family Medicine

## 2018-09-03 DIAGNOSIS — G8929 Other chronic pain: Secondary | ICD-10-CM

## 2018-09-03 DIAGNOSIS — M5442 Lumbago with sciatica, left side: Principal | ICD-10-CM

## 2018-09-03 DIAGNOSIS — M5441 Lumbago with sciatica, right side: Principal | ICD-10-CM

## 2018-09-03 MED ORDER — PREDNISONE 20 MG PO TABS: ORAL_TABLET | ORAL | 0 refills | Status: AC

## 2018-09-03 MED ORDER — FUROSEMIDE 20 MG PO TABS: 20.0000 mg | ORAL_TABLET | Freq: Every day | ORAL | 0 refills | Status: DC | PRN

## 2018-09-03 NOTE — Telephone Encounter (Signed)
[facility] notice patient's swelling from lower bilateral extremities which is very hard and swollen but lungs sounds clear. Patient is only taking one diuretics chlorthalidone 25 mg daily. Please call with suggestion Francisco Gomez [phone].

## 2018-09-03 NOTE — Telephone Encounter (Signed)
Family informed.

## 2018-09-03 NOTE — Telephone Encounter (Signed)
Verbal was given to Francisco Gomez but family wants to find out if prednisone can be Rx help his back pain and prescribed previously by surgeon.

## 2018-09-03 NOTE — Telephone Encounter (Signed)
Francisco Gomez with [facility] needs a verbal for PT twice a week for 2 weeks, 3 times a week for 1 week and twice a week for 3 weeks.  She also want a home health aid to help with bathing twice a week for 4 weeks.  Family told her that prednisone really helped his back previously [phone]

## 2018-09-03 NOTE — Telephone Encounter (Signed)
He has history of swelling in lower extremity. This is from veins and also sedentary limited mobility.  I can send other diuretic Lasix 20mg  daily PRN to try for now, he may take 1-2 pills a day for 3-5 days then I would stop. He should follow-up in future if not improving.  If develops pain, redness or changes in only one leg then he may need to be seen quicker or at hospital if significant swelling.  Saralyn PilarAlexander Karamalegos, DO [facility] 09/03/2018, 7:09 PM

## 2018-09-03 NOTE — Telephone Encounter (Signed)
Please notify family that he may try a prednisone taper for back pain at this time. Sent rx Prednisone instructions for taper over 7 days.  Saralyn PilarAlexander Karamalegos, DO [facility] 09/03/2018, 12:18 PM

## 2018-09-04 NOTE — Telephone Encounter (Signed)
Francisco Gomez advised as per Dr. Kirtland BouchardK and she will notify family.

## 2018-09-08 ENCOUNTER — Telehealth: Payer: Self-pay | Admitting: Family Medicine

## 2018-09-08 ENCOUNTER — Telehealth: Payer: Self-pay

## 2018-09-08 NOTE — Telephone Encounter (Signed)
Yes.  Two diuretics are fine and are used together at times.  Again, will allow Dr. Althea CharonKaramalegos to review on return for final decision.

## 2018-09-08 NOTE — Telephone Encounter (Signed)
Informed Francisco Gomez as per Lauren's suggestion but he still would like to know if it's okay for patient's being on 2 different diuretics.

## 2018-09-08 NOTE — Telephone Encounter (Signed)
Issues are well-known to Dr. Althea CharonKaramalegos.  Patient is awaiting urology referral for bladder mass.  Last kidney function 12/12 was normal.  Has repeat visit with Dr. Althea CharonKaramalegos in January 2020.   - Will defer any changes to Dr. Althea CharonKaramalegos until his return on Friday 09/12/2018.  Patient should call or go to ED if he stops voiding in any 24-48 hour period of time.

## 2018-09-08 NOTE — Telephone Encounter (Signed)
Joseph informed

## 2018-09-08 NOTE — Telephone Encounter (Signed)
Jomarie LongsJoseph P[facility] from [facility] is concerned about patient's being on 2 diuretics Furosemide and chlorthalidone 25 mg his pulse being 56 and 112/56 was around at 10:00 am today. Patient is asymptomatic but not urinating frequently either. This is just FYI from [place] and also concerned about patient taking 2 diuretics.

## 2018-09-08 NOTE — Telephone Encounter (Signed)
Jomarie LongsJoseph with [facility] said while seeing patient today, his BP was 112/56 and heart rate was 56.  He is also concerned about pt taking 2 diuretics.  Please call [phone]

## 2018-09-08 NOTE — Telephone Encounter (Signed)
See other message

## 2018-09-12 NOTE — Telephone Encounter (Signed)
Joseph informed as per Dr. K. 

## 2018-09-12 NOTE — Telephone Encounter (Signed)
Agree with the plan below.  He should be on Chlorthalidone HALF tablet daily and the Furosemide (Lasix) new diuretic should be ONLY AS NEEDED, not every day. If he has significant swelling then he may take a dose. His BP of 120s/50-60s is fine. They may HOLD this Furosemide (lasix) if his BP is < 100/50 - that is fine if they are concerned. Otherwise he can take both diuretics at times if they are needed.  Saralyn PilarAlexander Abhijay Morriss, DO [facility] 09/12/2018, 8:13 AM

## 2018-09-15 ENCOUNTER — Telehealth: Payer: Self-pay | Admitting: Family Medicine

## 2018-09-15 DIAGNOSIS — M5441 Lumbago with sciatica, right side: Principal | ICD-10-CM

## 2018-09-15 DIAGNOSIS — G8929 Other chronic pain: Secondary | ICD-10-CM

## 2018-09-15 DIAGNOSIS — M5442 Lumbago with sciatica, left side: Principal | ICD-10-CM

## 2018-09-15 MED ORDER — BACLOFEN 5 MG PO TABS: 5.0000 mg | ORAL_TABLET | Freq: Three times a day (TID) | ORAL | 1 refills | Status: AC | PRN

## 2018-09-15 NOTE — Telephone Encounter (Signed)
Called Francisco Gomez [facility] back, he saw patient today and patient reported "stiffness" and some dull ache in back, no significant pain, he said prednisone was helpful. I advised that we should avoid repeated prednisone course for now, this can cause other complications, we can re-do a prednisone taper in future again if needed, otherwise may defer this to Neurosurgery.  I recommended and sent in new rx Baclofen 5mg  tablets low dose 2-3 times a day as needed for muscle spasm and stiffness, caution sedation, sent to pharmacy  Francisco Gomez will see patient tomorrow.  Advised limited coverage - I will be out of office tomorrow and Wednesday w/ New years day, if he needs something can call and seek help, or reach us at end of week.  Francisco PilarAlexander Raychel Dowler, DO [facility] 09/15/2018, 4:02 PM

## 2018-09-15 NOTE — Telephone Encounter (Signed)
Francisco Gomez, PT with [facility] said pt is no longer taking prednisone and pt is complaining about stiffness and pain in back.  His call back number is [phone]

## 2018-09-16 ENCOUNTER — Telehealth: Payer: Self-pay | Admitting: Family Medicine

## 2018-09-16 NOTE — Telephone Encounter (Signed)
Francisco Gomez with [facility] PT have to report if pulse being lower than 60 and also baclofen was Rx yesterday which in their system showing moderate level 3 interaction which he needs to report to PCP office. His temp was lower than normal but patient is asymptomatic.

## 2018-09-16 NOTE — Telephone Encounter (Signed)
Acknowledged. They can continue Baclofen with Tramadol. Need to monitor for sedation. Otherwise would be okay, prefer to administer at different times of day. HR >50 is appropriate.  Francisco PilarAlexander Sade Mehlhoff, DO [facility] 09/16/2018, 3:01 PM

## 2018-09-16 NOTE — Telephone Encounter (Signed)
Jomarie LongsJoseph informed as per Dr. Kirtland BouchardK.

## 2018-09-16 NOTE — Telephone Encounter (Signed)
Francisco Gomez with [facility] PT said pt's resting heart rate was 56 per minute and temperature was 96.1.  He also said pt has level 3 drug interaction taking baclofen and tramadol.  His call back number is [phone]

## 2018-09-19 ENCOUNTER — Telehealth: Payer: Self-pay | Admitting: Family Medicine

## 2018-09-19 NOTE — Telephone Encounter (Signed)
Acknowledged.  Saralyn PilarAlexander Samanthajo Payano, DO [facility] 09/19/2018, 1:40 PM

## 2018-09-19 NOTE — Telephone Encounter (Signed)
[facility] called [phone] , states they were unable to see pt today per daughter in  Law pt had diarrhea for 2-3 days

## 2018-09-19 NOTE — Telephone Encounter (Signed)
Acknowledged.  Brien Lowe, DO [facility] 09/19/2018, 1:40 PM  

## 2018-09-19 NOTE — Telephone Encounter (Signed)
Francisco Gomez with [facility] PT called to report a missed visit.

## 2018-09-22 ENCOUNTER — Telehealth: Payer: Self-pay | Admitting: Family Medicine

## 2018-09-23 ENCOUNTER — Encounter: Payer: Self-pay | Admitting: Family Medicine

## 2018-09-23 ENCOUNTER — Ambulatory Visit
Admission: RE | Admit: 2018-09-23 | Discharge: 2018-09-23 | Disposition: A | Discharge status: Home / Self Care | Payer: Federal, State, Local not specified - PPO | Source: Ambulatory Visit | Attending: Family Medicine | Admitting: Family Medicine

## 2018-09-23 ENCOUNTER — Ambulatory Visit (INDEPENDENT_AMBULATORY_CARE_PROVIDER_SITE_OTHER): Payer: Federal, State, Local not specified - PPO | Admitting: Family Medicine

## 2018-09-23 VITALS — BP 111/47 | HR 69 | Temp 98.0°F | Resp 16 | Ht 69.0 in

## 2018-09-23 DIAGNOSIS — K5909 Other constipation: Secondary | ICD-10-CM

## 2018-09-23 DIAGNOSIS — Z952 Presence of prosthetic heart valve: Secondary | ICD-10-CM | POA: Diagnosis not present

## 2018-09-23 DIAGNOSIS — R197 Diarrhea, unspecified: Secondary | ICD-10-CM

## 2018-09-23 DIAGNOSIS — Z23 Encounter for immunization: Secondary | ICD-10-CM

## 2018-09-23 DIAGNOSIS — G8929 Other chronic pain: Secondary | ICD-10-CM

## 2018-09-23 DIAGNOSIS — G609 Hereditary and idiopathic neuropathy, unspecified: Secondary | ICD-10-CM

## 2018-09-23 DIAGNOSIS — M5441 Lumbago with sciatica, right side: Secondary | ICD-10-CM

## 2018-09-23 DIAGNOSIS — R600 Localized edema: Secondary | ICD-10-CM

## 2018-09-23 DIAGNOSIS — L89151 Pressure ulcer of sacral region, stage 1: Secondary | ICD-10-CM

## 2018-09-23 DIAGNOSIS — M5442 Lumbago with sciatica, left side: Secondary | ICD-10-CM

## 2018-09-23 MED ORDER — PREGABALIN 200 MG PO CAPS: 200.0000 mg | ORAL_CAPSULE | Freq: Two times a day (BID) | ORAL | 2 refills | Status: AC

## 2018-09-23 NOTE — Assessment & Plan Note (Signed)
Persistent chronic problem See prior note and background history, complicated by lumbar spinal stenosis and disc disease OA/DJD, prior back surgery  - RESTART Baclofen 5mg  low dose PRN with some improvement - Follow-up as scheduled w/ [facility] Neurosurgery 10/13/18

## 2018-09-23 NOTE — Patient Instructions (Addendum)
Thank you for coming to the office today.  Restart Baclofen, if helping, trial to see if helping, I dont think it was causing the diarrhea, but to be determined.  May try one pill of Furosemide 20mg  as needed - only if BP is >100/50 - if lower than this I would be cautious with fluid pill  Resume regular diet  Stay well hydrated  Flu shot today  X-ray of abdomen to check for retained stool or partial blockage of stool - if we see this then may need to try Lactulose or Miralax to clean out impaction. Then this should reduce the diarrhea after it resets.  If no stool blockage, then we will try Imodium as needed for diarrhea - follow instructions on package  Acute diarrhea: Oral: Initial: 4 mg, followed by 2 mg after each loose stool (maximum: 16 mg/day) - try to limit it to about 6 to 10 mg per day short term only if this is needed, otherwise would NOT TAKE FOR MORE THAN 2-3 days at a time  Refilled Lyrica today  Follow-up with specialists  Bed sore - as discussed, it looks like superficial layer only - try to adjust position avoid direct pressure, may use alternating barrier cream and powder to keep try.  ---------  Stay tuned for Cardiologist apt - for ECHO and evaluating heart valve  [facility]) HeartCare at [facility] [address] [place], [place] [address] Main: [phone]     Please schedule a Follow-up Appointment to: Return in about 3 months (around 12/23/2018) for 3 month follow-up - HTN, Edema, Neuropathy, Sacral Wound.  If you have any other questions or concerns, please feel free to call the office or send a message through MyChart. You may also schedule an earlier appointment if necessary.  Additionally, you may be receiving a survey about your experience at our office within a few days to 1 week by e-mail or mail. We value your feedback.  Saralyn Pilar, DO [facility] [place]

## 2018-09-23 NOTE — Assessment & Plan Note (Signed)
Clinically with very superficial R sided gluteal cleft ulceration small, consistent with pressure sore, in sedentary patient, recent worsening with frequent cleansing and washing of skin due to diarrhea illness - No sign of secondary infection or other sore  Plan Continue with barrier cream, emphasized goal to change positions more often and offload with cushion/pillow as best as can, may keep area dry and clean as advised to limit further progression - If worsening or new concern, consider Wound Center referral

## 2018-09-23 NOTE — Assessment & Plan Note (Addendum)
Chronic problem w/ aortic valvular disease s/p AVR 09/2017 in [place] at [facility] Dr Kathe Mariner  Referral today now 1 year later post op for local Cardiology evaluation for AVR, requesting ECHO and clinical evaluation

## 2018-09-23 NOTE — Assessment & Plan Note (Signed)
Episodic worsening LE edema, seems improved w/ elevation and diuretic Likely secondary to venous stasis, and sedentary  Plan He may restart Fuorsemide 20mg  PRN only - if BP >100/60 this is okay to try as needed Continue thiazide half dose chlorthalidone 12.5mg  daily for now He will see Cardiology as well - for AVR May consider vascular consult vs imaging venous if not improved in future

## 2018-09-23 NOTE — Progress Notes (Signed)
Subjective:    Patient ID: Francisco Gomez, male    DOB: Feb 11, 1927, 85 y.o.   MRN: [id number]  Francisco Gomez is a 85 y.o. male presenting on 09/23/2018 for Diarrhea (onset 3 days starting from past Thursday, Saturday was no Sx and back on Sunday worst and no Sxs yesterday or today and he is sore from wiping and cleaning and feets swollen now)  Accompanied by family members today, daughter, [name], and wife, [name] - they provide additional history today.  HPI   Diarrhea / History of Constipation Reports symptoms onset about 6 days ago last week with diarrhea and loose stools, had recurrent episodes of diarrhea for few days even at night. Then improved over past weekend, Saturday (about 3 days ago) he was doing significantly better without any episode of diarrhea. Then it seemed to come back few days later. - Medication changes: reduced Chlorthalidone to 12.5mg , added Furosemide, also they have stopped Baclofen - They have changed diet to more bland diet and avoiding fresh fruits and vegetables - Not tried any OTC medicine such as Imodium. Tried to keep hydrated well - History of constipation and fecal impaction in past, he normally takes Senna and Stool Softener, they stopped these recently but have asked if he should add back in  Peripheral Edema Previously had made some progress in past, he was taken off Furosemide 20mg  however about 2 weeks ago after they had concern with lower BP. He was on Chlorthalidione half tab 12.5mg  daily. Seems edema has increased some, episodic if active seems improved, if sedentary worsening. They were not aware they could still take Furosemide PRN ONLY.  S/p Aortic Valve Replacement 09/2017 Known history of cardiac disease, s/p AVR in 09/2017, he was on plavix in past. He is requesting to establish with Cardiology locally to have ECHOcardiogram done. Previously followed by [name], MD [facility] Cardiology.  Chronic Back Pain / Peripheral  Neuropathy / Lumbar Spinal Stenosis, discectomy Remains stable in past month since last visit with me. He was referred to [name] Neurosurgery by request for 2nd opinion, given complicated spinal history with surgeries in past. He has improved on Baclofen low dosage 5mg  PRN. They stopped baclofen temporarily due to question if related to diarrhea, now will try again. - He is due for refill on Lyrica 200mg  BID - Initial consult apt with [name]surgery is 10/13/18  Sacral Ulceration, Stage I New problem identified by family member after changing, with localized 1 cm vs pea sized spot to Right of coccyx in sacral area, thought it is a slightly ulcerated opening likely bed sore, and they have been using barrier cream but concern with frequent diarrhea episodes. - Predominantly in seated or reclining position - Using barrier cream - Denies any drainage, redness, tender, fever chills, other sores  Home Health Update Patient continues to receive [name] services, and is doing well with them. They are very appreciative of this service and he seems to be making progress with nursing and therapy, recent missed some visits due to diarrheal illness.   Health Maintenance: Due for High Dose Flu Shot, will receive today   He will be due for 2nd dose Pneumonia vaccine at next visit Pneumovax-23 booster, last dose pneumonia vaccine several years ago.   Depression screen PHQ 2/9 08/21/2018  Decreased Interest 1  Down, Depressed, Hopeless 2  PHQ - 2 Score 3  Altered sleeping 2  Tired, decreased energy 3  Change in appetite 0  Feeling bad or failure about yourself  3  Trouble concentrating 2  Moving slowly or fidgety/restless 0  Suicidal thoughts 0  PHQ-9 Score 13  Difficult doing work/chores Somewhat difficult    Social History   Tobacco Use  . Smoking status: Never Smoker  . Smokeless tobacco: Never Used  Substance Use Topics  . Alcohol use: Yes    Alcohol/week: 7.0 standard drinks      Types: 7 Standard drinks or equivalent per week    Frequency: Never  . Drug use: Never    Review of Systems Per HPI unless specifically indicated above     Objective:    BP (!) 111/47   Pulse 69   Temp 98 F (36.7 C) (Oral)   Resp 16   Ht 5\' 9"  (1.753 m)   BMI 25.10 kg/m   Wt Readings from Last 3 Encounters:  08/28/18 170 lb (77.1 kg)    Physical Exam Vitals signs and nursing note reviewed.  Constitutional:      General: He is not in acute distress.    Appearance: He is well-developed. He is not diaphoretic.     Comments: Well-appearing, comfortable, cooperative  HENT:     Head: Normocephalic and atraumatic.  Eyes:     General:        Right eye: No discharge.        Left eye: No discharge.     Conjunctiva/sclera: Conjunctivae normal.  Neck:     Musculoskeletal: Normal range of motion and neck supple.     Thyroid: No thyromegaly.  Cardiovascular:     Rate and Rhythm: Normal rate and regular rhythm.  Pulmonary:     Effort: Pulmonary effort is normal.     Breath sounds: Normal breath sounds.  Abdominal:     General: Bowel sounds are normal. There is no distension.     Palpations: Abdomen is soft. There is no mass.     Tenderness: There is no abdominal tenderness. There is no guarding.  Musculoskeletal: Normal range of motion.  Lymphadenopathy:     Cervical: No cervical adenopathy.  Skin:    General: Skin is warm and dry.     Findings: No rash.     Comments: Gluteal cleft sacral region R sided with 1 x 1.5 cm area of superficial skin loss without deeper subcutaneous involvement. No drainage. Non tender. Discoloration within gluteal cleft.  Neurological:     Mental Status: He is alert and oriented to person, place, and time.  Psychiatric:        Behavior: Behavior normal.     Comments: Well groomed, good eye contact, normal speech and thoughts     I have personally reviewed the radiology report from [facility] on 09/23/18.  CLINICAL DATA:  Diarrhea,  constipation.  EXAM: ABDOMEN - 1 VIEW  COMPARISON:  None.  FINDINGS: The bowel gas pattern is normal. Mild amount of stool seen throughout the colon. Atherosclerosis of abdominal aorta is noted. No radio-opaque calculi or other significant radiographic abnormality are seen.  IMPRESSION: Mild stool burden is noted. No evidence of bowel obstruction or ileus.  Aortic Atherosclerosis (ICD10-I70.0).   Electronically Signed   By: Lupita RaiderJames  Green Jr, M.D.   On: 09/23/2018 14:46  Results for orders placed or performed during the hospital encounter of 08/28/18  CBC with Differential  Result Value Ref Range   WBC 5.7 4.0 - 10.5 K/uL   RBC 3.15 (L) 4.22 - 5.81 MIL/uL   Hemoglobin 10.0 (L) 13.0 -  17.0 g/dL   HCT 81.1 (L) 91.4 - 78.2 %   MCV 96.2 80.0 - 100.0 fL   MCH 31.7 26.0 - 34.0 pg   MCHC 33.0 30.0 - 36.0 g/dL   RDW 95.6 (H) 21.3 - 08.6 %   Platelets 148 (L) 150 - 400 K/uL   nRBC 0.0 0.0 - 0.2 %   Neutrophils Relative % 74 %   Neutro Abs 4.2 1.7 - 7.7 K/uL   Lymphocytes Relative 16 %   Lymphs Abs 0.9 0.7 - 4.0 K/uL   Monocytes Relative 8 %   Monocytes Absolute 0.5 0.1 - 1.0 K/uL   Eosinophils Relative 1 %   Eosinophils Absolute 0.1 0.0 - 0.5 K/uL   Basophils Relative 0 %   Basophils Absolute 0.0 0.0 - 0.1 K/uL   Immature Granulocytes 1 %   Abs Immature Granulocytes 0.06 0.00 - 0.07 K/uL  Basic metabolic panel  Result Value Ref Range   Sodium 129 (L) 135 - 145 mmol/L   Potassium 3.4 (L) 3.5 - 5.1 mmol/L   Chloride 92 (L) 98 - 111 mmol/L   CO2 26 22 - 32 mmol/L   Glucose, Bld 121 (H) 70 - 99 mg/dL   BUN 26 (H) 8 - 23 mg/dL   Creatinine, Ser 5.78 0.61 - 1.24 mg/dL   Calcium 8.9 8.9 - 46.9 mg/dL   GFR calc non Af Amer 57 (L) >60 mL/min   GFR calc Af Amer >60 >60 mL/min   Anion gap 11 5 - 15  Urinalysis, Complete w Microscopic  Result Value Ref Range   Color, Urine YELLOW (A) YELLOW   APPearance CLEAR (A) CLEAR   Specific Gravity, Urine 1.010 1.005 - 1.030    pH 7.0 5.0 - 8.0   Glucose, UA NEGATIVE NEGATIVE mg/dL   Hgb urine dipstick SMALL (A) NEGATIVE   Bilirubin Urine NEGATIVE NEGATIVE   Ketones, ur NEGATIVE NEGATIVE mg/dL   Protein, ur NEGATIVE NEGATIVE mg/dL   Nitrite NEGATIVE NEGATIVE   Leukocytes, UA NEGATIVE NEGATIVE   RBC / HPF 6-10 0 - 5 RBC/hpf   WBC, UA 0-5 0 - 5 WBC/hpf   Bacteria, UA NONE SEEN NONE SEEN   Squamous Epithelial / LPF 0-5 0 - 5      Assessment & Plan:   Problem List Items Addressed This Visit    Bilateral lower extremity edema    Episodic worsening LE edema, seems improved w/ elevation and diuretic Likely secondary to venous stasis, and sedentary  Plan He may restart Fuorsemide 20mg  PRN only - if BP >100/60 this is okay to try as needed Continue thiazide half dose chlorthalidone 12.5mg  daily for now He will see Cardiology as well - for AVR May consider vascular consult vs imaging venous if not improved in future      Chronic back pain    Persistent chronic problem See prior note and background history, complicated by lumbar spinal stenosis and disc disease OA/DJD, prior back surgery  - RESTART Baclofen 5mg  low dose PRN with some improvement - Follow-up as scheduled w/ [facility] Neurosurgery 10/13/18      Idiopathic peripheral neuropathy - Primary    Persistent chronic problem 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, s/p back surgery Likely exacerbated by edema as well  Plan Refill Lyrica 200mg  BID - same dose as before, continue for now Follow-up as scheduled w/ [facility] Neurosurgery as scheduled on 10/13/18 - may benefit from other therapy      Relevant Medications   pregabalin (  LYRICA) 200 MG capsule   Pressure ulcer of sacral region, stage 1    Clinically with very superficial R sided gluteal cleft ulceration small, consistent with pressure sore, in sedentary patient, recent worsening with frequent cleansing and washing of skin due to diarrhea illness - No sign of secondary infection or other  sore  Plan Continue with barrier cream, emphasized goal to change positions more often and offload with cushion/pillow as best as can, may keep area dry and clean as advised to limit further progression - If worsening or new concern, consider Wound Center referral      S/P aortic valve replacement    Chronic problem w/ aortic valvular disease [place] [facility] [place] [facility]  Referral today now 1 year later post op for local Cardiology evaluation for AVR, requesting ECHO and clinical evaluation      Relevant Orders   Ambulatory referral to Cardiology    Other Visit Diagnoses    Other constipation       Relevant Orders   DG Abd 1 View (Completed)   Diarrhea, unspecified type       Relevant Orders   DG Abd 1 View (Completed)   Needs flu shot       Relevant Orders   Flu vaccine HIGH DOSE PF (Completed)      DIARRHEA - Clinically seems to be functional, episodic - has alternating constipation as well. - Tolerating PO well, hydrated - Check [facility] today - restart baclofen, less likely cause  Called patient w/ results - Mild stool burden, advised that likely this is not acute cause of diarrhea or encopresis. Unlikely to have obstruction or impaction at this time. Advised he can resume regular diet. May start back on stool softener regularly again to help improve regular bowel movements since his actual bowel movements have slowed down. If too much diarrhea he can switch to Imodium OTC as advised temporarily. Follow-up based on course as advised.  Meds ordered this encounter  Medications  . pregabalin (LYRICA) 200 MG capsule    Sig: Take 1 capsule (200 mg total) by mouth 2 (two) times daily.    Dispense:  60 capsule    Refill:  2   Orders Placed This Encounter  Procedures  . DG Abd 1 View    Standing Status:   Future    Number of Occurrences:   1    Standing Expiration Date:   11/23/2019    Order Specific Question:   Reason for Exam (SYMPTOM  OR DIAGNOSIS  REQUIRED)    Answer:   diarrhea for a week, history of constipation, check for retained stool and rule out encopresis / fecal impaction    Order Specific Question:   Preferred imaging location?    Answer:   [facility]  . Flu vaccine HIGH DOSE PF  . Ambulatory referral to Cardiology    Referral Priority:   Routine    Referral Type:   Consultation    Referral Reason:   Specialty Services Required    Requested Specialty:   Cardiology    Number of Visits Requested:   1    Follow up plan: Return in about 3 months (around 12/23/2018) for 3 month follow-up - HTN, Edema, Neuropathy, Sacral Wound.  Saralyn PilarAlexander , DO [facility] 09/23/2018, 1:35 PM

## 2018-09-23 NOTE — Assessment & Plan Note (Signed)
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

## 2018-09-30 ENCOUNTER — Other Ambulatory Visit: Payer: Self-pay | Admitting: Student

## 2018-09-30 DIAGNOSIS — M545 Low back pain: Principal | ICD-10-CM

## 2018-09-30 DIAGNOSIS — G8929 Other chronic pain: Secondary | ICD-10-CM

## 2018-09-30 NOTE — Telephone Encounter (Signed)
Opened in error by Rachell barnett

## 2018-10-01 ENCOUNTER — Other Ambulatory Visit: Payer: Self-pay | Admitting: Student

## 2018-10-01 ENCOUNTER — Telehealth: Payer: Self-pay | Admitting: Family Medicine

## 2018-10-01 ENCOUNTER — Ambulatory Visit: Payer: Federal, State, Local not specified - PPO | Admitting: Family Medicine

## 2018-10-01 DIAGNOSIS — M150 Primary generalized (osteo)arthritis: Principal | ICD-10-CM

## 2018-10-01 DIAGNOSIS — M545 Low back pain, unspecified: Secondary | ICD-10-CM

## 2018-10-01 DIAGNOSIS — M159 Polyosteoarthritis, unspecified: Secondary | ICD-10-CM

## 2018-10-01 DIAGNOSIS — G8929 Other chronic pain: Secondary | ICD-10-CM

## 2018-10-01 MED ORDER — DICLOFENAC SODIUM 1 % TD GEL: 2.0000 g | Freq: Three times a day (TID) | TRANSDERMAL | 2 refills | Status: AC | PRN

## 2018-10-01 NOTE — Telephone Encounter (Signed)
Please notify them that I agree with this and have sent rx for Diclofenac (generiv Voltaren gel) may be used on shoulder as needed up to 2-3 times daily for pain.  They may need to print a coupon from [employer] to get a discount if the cost is too high through insurance.  Saralyn Pilar, DO [facility] 10/01/2018, 3:11 PM

## 2018-10-01 NOTE — Telephone Encounter (Signed)
Pt daughter in law called said that therapist just left house, wanted pt to call and request voltaran  Gel for shoulder pain. Pt cal back # is [phone]

## 2018-10-02 ENCOUNTER — Telehealth: Payer: Self-pay | Admitting: Family Medicine

## 2018-10-02 NOTE — Telephone Encounter (Signed)
Patient's daughter in law advised.  °

## 2018-10-02 NOTE — Telephone Encounter (Signed)
[facility] a call from [facility] stating that pt missed his appt.

## 2018-10-02 NOTE — Telephone Encounter (Signed)
Acknowledged. Signed fax already  Saralyn Pilar, DO [facility] 10/02/2018, 1:56 PM

## 2018-10-08 ENCOUNTER — Telehealth: Payer: Self-pay | Admitting: Family Medicine

## 2018-10-08 NOTE — Telephone Encounter (Signed)
Paper written Rx preferred as per Harriett Sine OT with [facility].

## 2018-10-08 NOTE — Telephone Encounter (Signed)
Francisco Gomez OT with [facility] asked for an order for juxtalite compression socks with velcro wrap for bilateral lower leg and asked about recommended level of compression.  Her call back [phone]

## 2018-10-09 NOTE — Telephone Encounter (Signed)
Daughter in law will stop by office to pick up Rx.

## 2018-10-09 NOTE — Telephone Encounter (Signed)
Handwritten paper rx for patient. Juxtalite Compression Socks with Velcro Wrap, 15-34mmHg pressure, dx bilateral LE edema (R60.0).  It is in my outgoing mailbox - please notify either patient or [facility] to pick it up or where to fax it to.  Saralyn Pilar, DO [facility] 10/09/2018, 12:56 PM

## 2018-10-13 ENCOUNTER — Telehealth: Payer: Self-pay | Admitting: Family Medicine

## 2018-10-13 ENCOUNTER — Ambulatory Visit: Payer: Federal, State, Local not specified - PPO | Admitting: Urology

## 2018-10-13 DIAGNOSIS — R600 Localized edema: Secondary | ICD-10-CM

## 2018-10-13 MED ORDER — FUROSEMIDE 20 MG PO TABS: 20.0000 mg | ORAL_TABLET | Freq: Every day | ORAL | 0 refills | Status: AC | PRN

## 2018-10-13 NOTE — Telephone Encounter (Signed)
Home health called said that pt will be discharged pt moved back home out of sate

## 2018-10-13 NOTE — Telephone Encounter (Signed)
Francisco Gomez asked for a refill on furosemide.  [id number] [address] [place] [id number].  She said she moved father back to [place].  Her call back [phone]

## 2018-10-13 NOTE — Telephone Encounter (Signed)
Acknowledged.  Francisco PilarAlexander Treydon Henricks, DO [facility] 10/13/2018, 11:33 AM

## 2018-10-13 NOTE — Telephone Encounter (Signed)
Can you call to let them know that I can provide one month temporary rx for this medicine until he re-establishes with new doctor in [place]?  I have just sent refill today Monday 1/27.  Saralyn Pilar, DO [facility] 10/13/2018, 5:11 PM

## 2018-10-14 ENCOUNTER — Ambulatory Visit: Payer: Federal, State, Local not specified - PPO

## 2018-10-14 NOTE — Telephone Encounter (Signed)
Patient advised.

## 2018-11-24 ENCOUNTER — Ambulatory Visit: Payer: Federal, State, Local not specified - PPO | Admitting: Internal Medicine

## 2018-12-31 ENCOUNTER — Ambulatory Visit: Payer: Federal, State, Local not specified - PPO | Admitting: Family Medicine

## 2020-10-18 IMAGING — CR DG HIP (WITH OR WITHOUT PELVIS) 2-3V*R*
3 series · 3 of 3 positions shown · non-contrast
Comparison: None.

CLINICAL DATA: Hip pain

EXAM:
DG HIP (WITH OR WITHOUT PELVIS) 2-3V RIGHT

[pelvis ap]
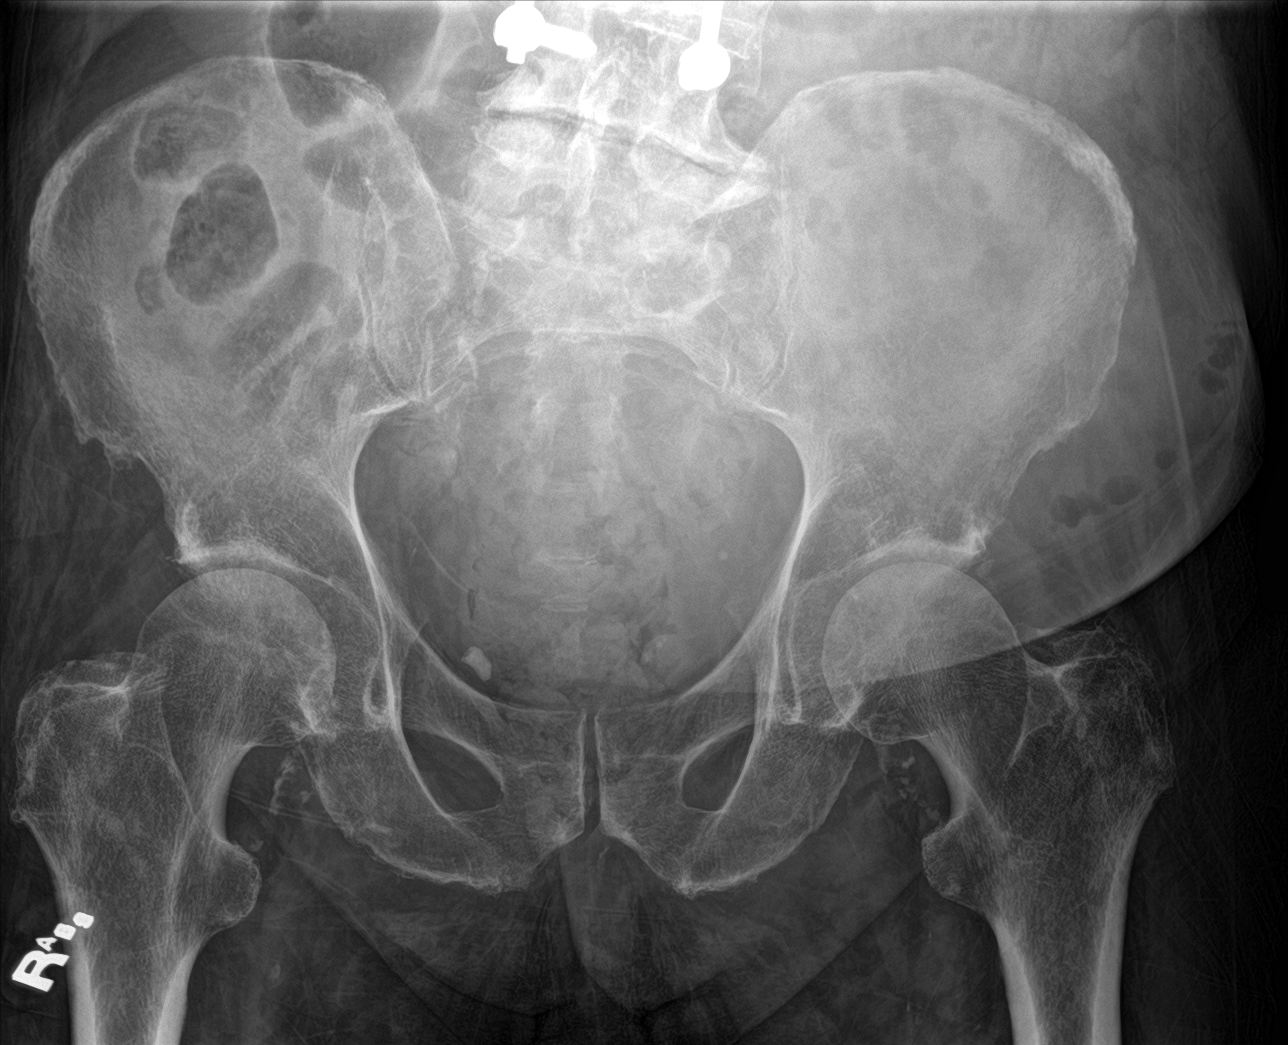

[hip ap]
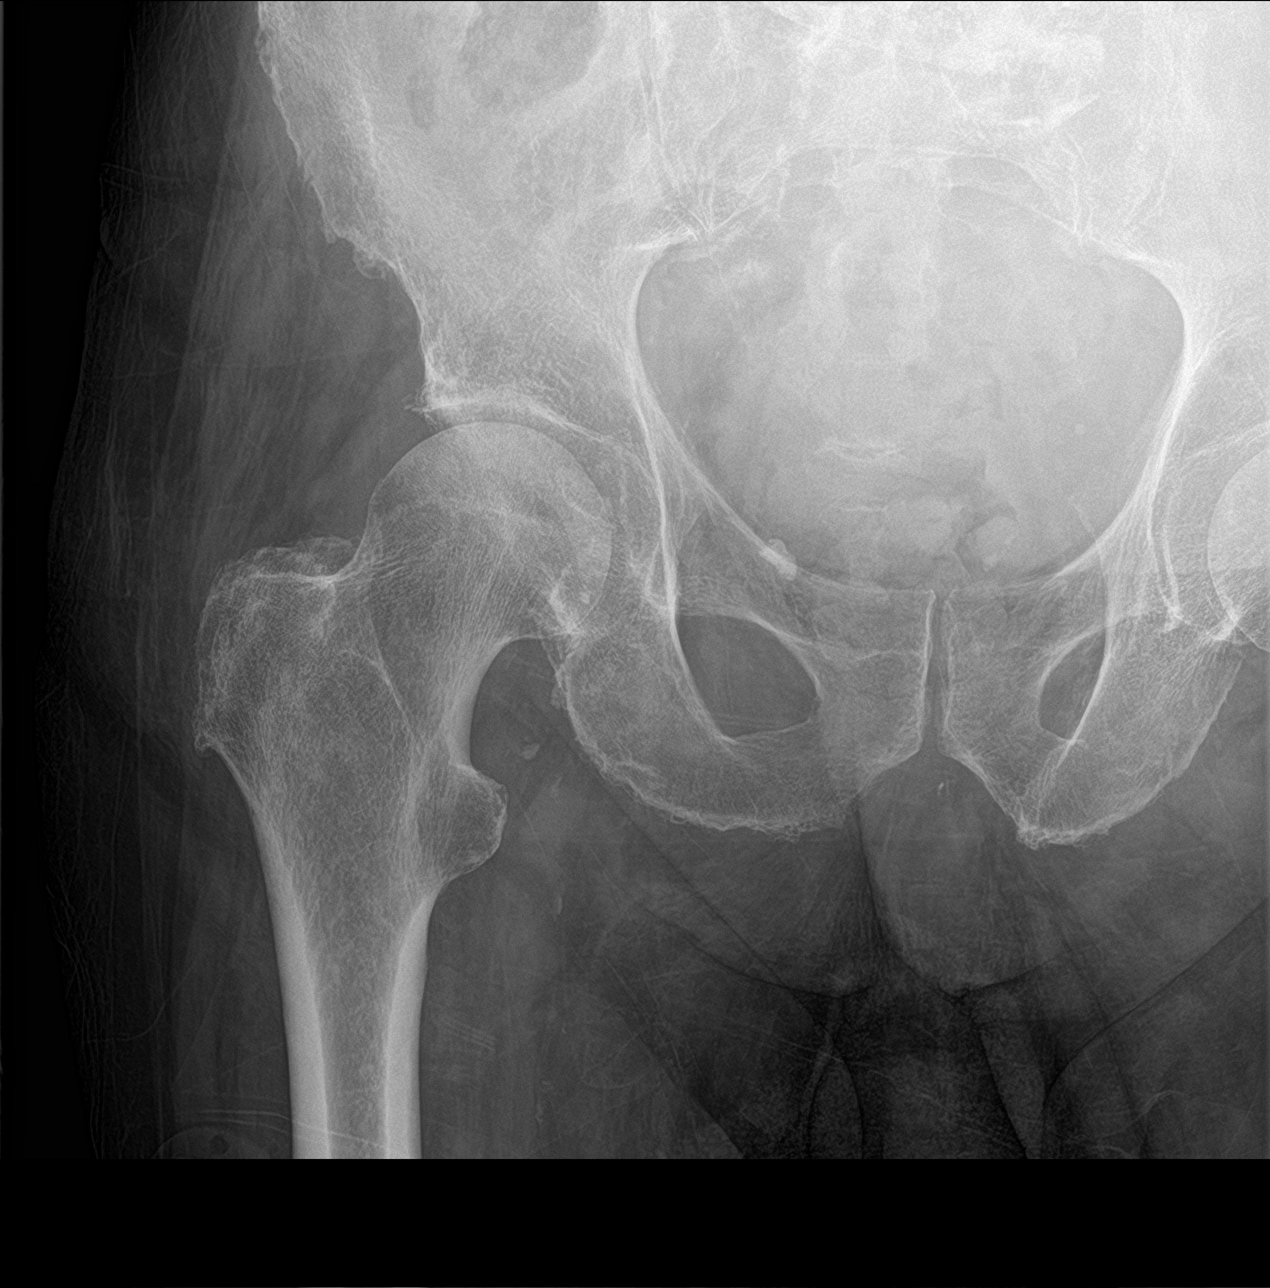

[hip lat]
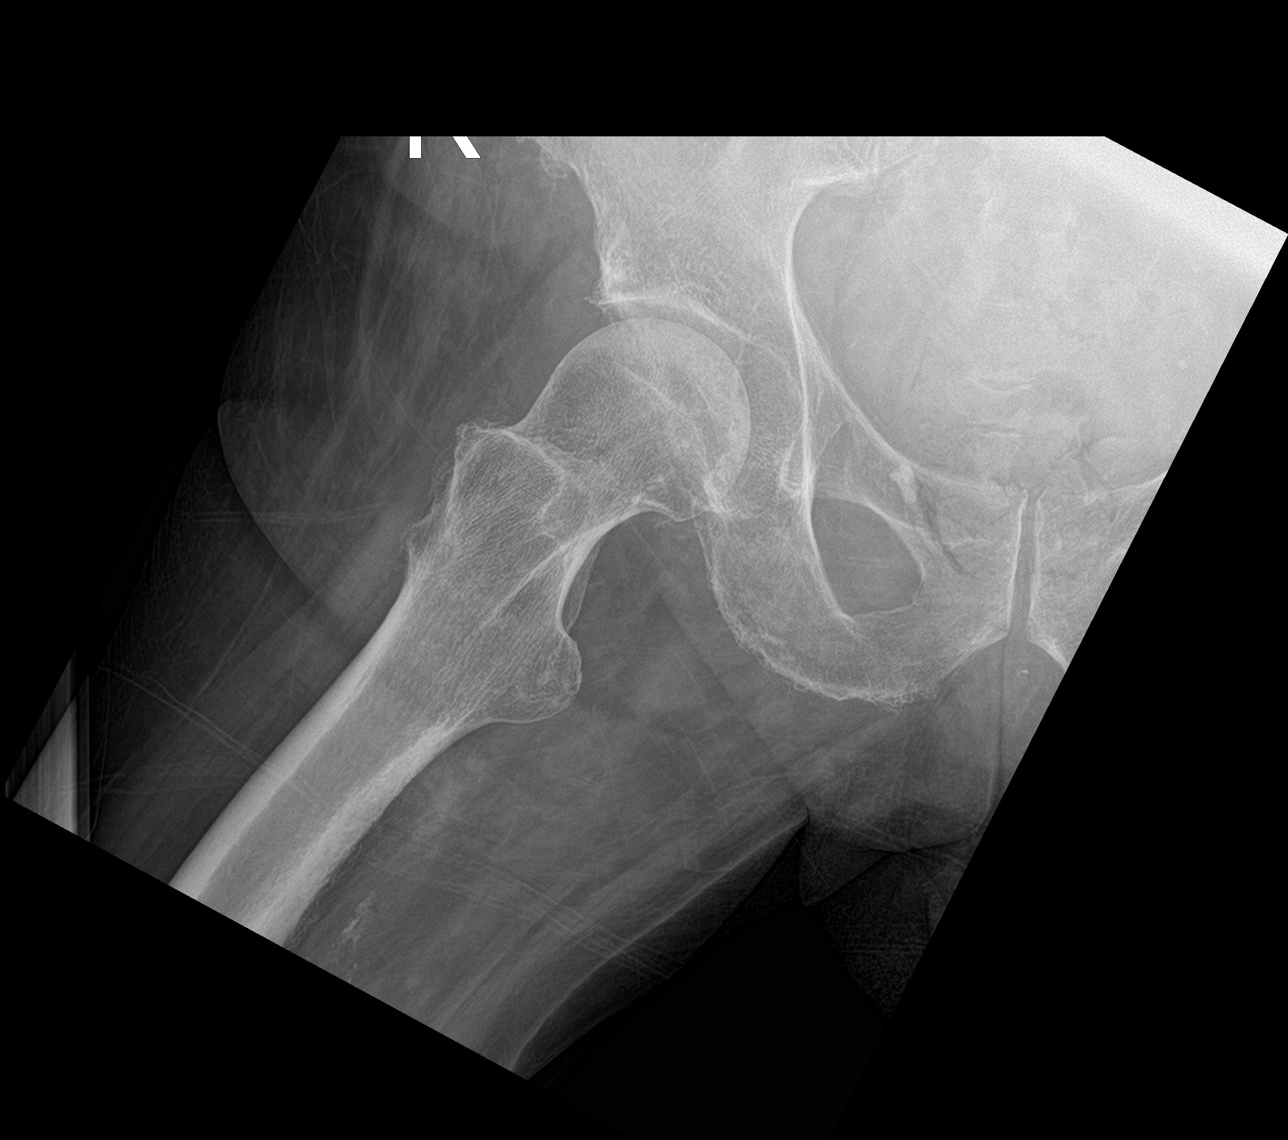

[3 of 3 positions shown; findings below may reference images not displayed]

FINDINGS: Symmetric mild degenerative changes in the hips with early spurring
and joint space narrowing. SI joints symmetric and unremarkable. No
acute bony abnormality. Specifically, no fracture, subluxation, or
dislocation.
IMPRESSION: Mild symmetric degenerative changes.  No acute bony abnormality.

## 2021-05-18 DEATH — deceased
